# Patient Record
Sex: Female | Born: 1950 | Hispanic: Yes | Smoking: Never smoker
Health system: Southern US, Community
[De-identification: ages and names within clinical notes are randomized; demographics above are authoritative.]

## PROBLEM LIST (undated history)

## (undated) DIAGNOSIS — N289 Disorder of kidney and ureter, unspecified: Secondary | ICD-10-CM

## (undated) DIAGNOSIS — I10 Essential (primary) hypertension: Secondary | ICD-10-CM

## (undated) DIAGNOSIS — E119 Type 2 diabetes mellitus without complications: Secondary | ICD-10-CM

---

## 2016-07-15 ENCOUNTER — Emergency Department
Admission: EM | Admit: 2016-07-15 | Discharge: 2016-07-15 | Disposition: A | Discharge status: Home / Self Care | Payer: Self-pay | Attending: Emergency Medicine | Admitting: Emergency Medicine

## 2016-07-15 ENCOUNTER — Encounter: Payer: Self-pay | Admitting: Emergency Medicine

## 2016-07-15 DIAGNOSIS — Z794 Long term (current) use of insulin: Secondary | ICD-10-CM | POA: Insufficient documentation

## 2016-07-15 DIAGNOSIS — E119 Type 2 diabetes mellitus without complications: Secondary | ICD-10-CM | POA: Insufficient documentation

## 2016-07-15 DIAGNOSIS — Z760 Encounter for issue of repeat prescription: Secondary | ICD-10-CM | POA: Insufficient documentation

## 2016-07-15 DIAGNOSIS — I10 Essential (primary) hypertension: Secondary | ICD-10-CM | POA: Insufficient documentation

## 2016-07-15 HISTORY — DX: Disorder of kidney and ureter, unspecified: N28.9

## 2016-07-15 HISTORY — DX: Essential (primary) hypertension: I10

## 2016-07-15 HISTORY — DX: Type 2 diabetes mellitus without complications: E11.9

## 2016-07-15 LAB — GLUCOSE, CAPILLARY: GLUCOSE-CAPILLARY: 224 mg/dL — AB (ref 65–99)

## 2016-07-15 MED ORDER — INSULIN NPH (HUMAN) (ISOPHANE) 100 UNIT/ML ~~LOC~~ SUSP: SUBCUTANEOUS | 0 refills | Status: AC

## 2016-07-15 NOTE — ED Notes (Signed)
Interpreter requested 

## 2016-07-15 NOTE — ED Triage Notes (Signed)
Patient states she is here visiting from [place] and forgot to bring her insulin.  States she needs her NPH Insulin.

## 2016-07-15 NOTE — ED Notes (Signed)
See triage note  States she is visiting  And forgot her insulin  Per her family she brought all of her PO meds   Denies any other problems at present

## 2016-07-15 NOTE — Discharge Instructions (Signed)
Obtain glucose meter. History insulin as directed by your doctor in [place].

## 2016-07-15 NOTE — ED Provider Notes (Signed)
[facility] Emergency Department Provider Note ____________________________________________  Time seen: 1:53 PM  I have reviewed the triage vital signs and the nursing notes.  HISTORY  Chief Complaint  Medication Refill Family member and Spanish interpreter  HPI Carolyn Ho is a 66 y.o. female this Monday by family members. Patient is visiting here from [place] and forgot to bring her insulin with her. Patient has her syringes but also forgot her glucometer. She is not taking her insulin for approximately 8 days. She denies any symptoms. Patient states that she takes NPH and has a paper that is partially in [place] showing this. Patient shows on her syringe how many units she has been instructed to use.   Past Medical History:  Diagnosis Date  . Diabetes mellitus without complication (HCC)   . Hypertension   . Renal disorder     There are no active problems to display for this patient.   History reviewed. No pertinent surgical history.  Prior to Admission medications   Medication Sig Start Date End Date Taking? Authorizing Provider  insulin NPH Human (HUMULIN N,NOVOLIN N) 100 UNIT/ML injection As directed 07/15/16   Tommi RumpsSummers, Abimelec Grochowski L, PA-C    Allergies Patient has no known allergies.  No family history on file.  Social History Social History  Substance Use Topics  . Smoking status: Never Smoker  . Smokeless tobacco: Never Used  . Alcohol use No    Review of Systems  Constitutional: Negative for fever. Gastrointestinal: Negative for abdominal pain, vomiting and diarrhea. Genitourinary: Negative for dysuria. Skin: Negative for rash. Neurological: Negative for headaches ____________________________________________  PHYSICAL EXAM:  VITAL SIGNS: ED Triage Vitals [07/15/16 1308]  Enc Vitals Group     BP (!) 158/70     Pulse Rate 78     Resp 16     Temp 97.8 F (36.6 C)     Temp Source Oral     SpO2 100 %     Weight    Height      Head Circumference      Peak Flow      Pain Score      Pain Loc      Pain Edu?      Excl. in GC?     Constitutional: Alert and oriented. Well appearing and in no distress. Head: Normocephalic and atraumatic. Eyes: Conjunctivae are normal.  Nose: No congestion Mouth/Throat: Mucous membranes are moist. Neck: No stridor Cardiovascular: Normal rate, regular rhythm. Normal distal pulses. Respiratory: Normal respiratory effort. No wheezes/rales/rhonchi. Musculoskeletal: Nontender with normal range of motion in all extremities.  Neurologic:  Normal gait without ataxia. Normal speech and language. No gross focal neurologic deficits are appreciated. Skin:  Skin is warm, dry and intact. No rash noted. Psychiatric: Mood and affect are normal. Patient exhibits appropriate insight and judgment. ____________________________________________   LABS (pertinent positives/negatives) Finger stick nonfasting was 224. ____________________________________________   INITIAL IMPRESSION / ASSESSMENT AND PLAN / ED COURSE  Insulin prescription was written for the patient. [facility] [employer] [facility] a glucometer for the time that she is here. Patient agrees to check her blood sugars daily. Prescription was written for her insulin and instructions were given to her by the Spanish interpreter.    ____________________________________________  FINAL CLINICAL IMPRESSION(S) / ED DIAGNOSES  Final diagnoses:  Encounter for medication refill     Tommi RumpsSummers, Kearstin Learn L, PA-C 07/15/16 1628    Jeanmarie PlantMcShane, James A, MD 07/16/16 [id number]

## 2017-06-20 ENCOUNTER — Inpatient Hospital Stay (HOSPITAL_COMMUNITY)
Admission: EM | Admit: 2017-06-20 | Discharge: 2017-06-22 | DRG: 689 | Disposition: A | Discharge status: Home / Self Care | Payer: Self-pay | Attending: Internal Medicine | Admitting: Internal Medicine

## 2017-06-20 ENCOUNTER — Encounter (HOSPITAL_COMMUNITY): Payer: Self-pay

## 2017-06-20 ENCOUNTER — Emergency Department (HOSPITAL_COMMUNITY): Payer: Self-pay

## 2017-06-20 ENCOUNTER — Other Ambulatory Visit: Payer: Self-pay

## 2017-06-20 DIAGNOSIS — N10 Acute tubulo-interstitial nephritis: Secondary | ICD-10-CM

## 2017-06-20 DIAGNOSIS — Z794 Long term (current) use of insulin: Secondary | ICD-10-CM

## 2017-06-20 DIAGNOSIS — N186 End stage renal disease: Secondary | ICD-10-CM | POA: Diagnosis present

## 2017-06-20 DIAGNOSIS — D631 Anemia in chronic kidney disease: Secondary | ICD-10-CM | POA: Diagnosis present

## 2017-06-20 DIAGNOSIS — N19 Unspecified kidney failure: Secondary | ICD-10-CM | POA: Diagnosis present

## 2017-06-20 DIAGNOSIS — E1122 Type 2 diabetes mellitus with diabetic chronic kidney disease: Secondary | ICD-10-CM | POA: Diagnosis present

## 2017-06-20 DIAGNOSIS — E875 Hyperkalemia: Secondary | ICD-10-CM | POA: Diagnosis present

## 2017-06-20 DIAGNOSIS — E785 Hyperlipidemia, unspecified: Secondary | ICD-10-CM | POA: Diagnosis present

## 2017-06-20 DIAGNOSIS — J90 Pleural effusion, not elsewhere classified: Secondary | ICD-10-CM | POA: Diagnosis present

## 2017-06-20 DIAGNOSIS — Z87440 Personal history of urinary (tract) infections: Secondary | ICD-10-CM

## 2017-06-20 DIAGNOSIS — N390 Urinary tract infection, site not specified: Principal | ICD-10-CM | POA: Diagnosis present

## 2017-06-20 DIAGNOSIS — I129 Hypertensive chronic kidney disease with stage 1 through stage 4 chronic kidney disease, or unspecified chronic kidney disease: Secondary | ICD-10-CM | POA: Diagnosis present

## 2017-06-20 LAB — CBC WITH DIFFERENTIAL/PLATELET
BASOS ABS: 0 10*3/uL (ref 0.0–0.1)
Basophils Relative: 1 %
EOS PCT: 8 %
Eosinophils Absolute: 0.3 10*3/uL (ref 0.0–0.7)
HEMATOCRIT: 27 % — AB (ref 36.0–46.0)
Hemoglobin: 8.8 g/dL — ABNORMAL LOW (ref 12.0–15.0)
LYMPHS PCT: 33 %
Lymphs Abs: 1.1 10*3/uL (ref 0.7–4.0)
MCH: 29.4 pg (ref 26.0–34.0)
MCHC: 32.6 g/dL (ref 30.0–36.0)
MCV: 90.3 fL (ref 78.0–100.0)
Monocytes Absolute: 0.2 10*3/uL (ref 0.1–1.0)
Monocytes Relative: 5 %
NEUTROS ABS: 1.8 10*3/uL (ref 1.7–7.7)
NEUTROS PCT: 53 %
PLATELETS: 158 10*3/uL (ref 150–400)
RBC: 2.99 MIL/uL — AB (ref 3.87–5.11)
RDW: 13.9 % (ref 11.5–15.5)
WBC: 3.4 10*3/uL — AB (ref 4.0–10.5)

## 2017-06-20 LAB — COMPREHENSIVE METABOLIC PANEL
ALT: 32 U/L (ref 14–54)
ANION GAP: 15 (ref 5–15)
AST: 137 U/L — ABNORMAL HIGH (ref 15–41)
Albumin: 4 g/dL (ref 3.5–5.0)
Alkaline Phosphatase: 57 U/L (ref 38–126)
BILIRUBIN TOTAL: 1.1 mg/dL (ref 0.3–1.2)
BUN: 85 mg/dL — ABNORMAL HIGH (ref 6–20)
CO2: 14 mmol/L — ABNORMAL LOW (ref 22–32)
Calcium: 8.5 mg/dL — ABNORMAL LOW (ref 8.9–10.3)
Chloride: 105 mmol/L (ref 101–111)
Creatinine, Ser: 11.09 mg/dL — ABNORMAL HIGH (ref 0.44–1.00)
GFR, EST AFRICAN AMERICAN: 4 mL/min — AB (ref >60)
GFR, EST NON AFRICAN AMERICAN: 3 mL/min — AB (ref >60)
Glucose, Bld: 95 mg/dL (ref 65–99)
POTASSIUM: 6.2 mmol/L — AB (ref 3.5–5.1)
Sodium: 134 mmol/L — ABNORMAL LOW (ref 135–145)
TOTAL PROTEIN: 6.8 g/dL (ref 6.5–8.1)

## 2017-06-20 LAB — I-STAT CG4 LACTIC ACID, ED: Lactic Acid, Venous: 0.46 mmol/L — ABNORMAL LOW (ref 0.5–1.9)

## 2017-06-20 LAB — I-STAT TROPONIN, ED: Troponin i, poc: 0.04 ng/mL (ref 0.00–0.08)

## 2017-06-20 MED ORDER — DEXTROSE 50 % IV SOLN
50.0000 mL | Freq: Once | INTRAVENOUS | Status: AC
  Administered 2017-06-21: 50 mL via INTRAVENOUS
  Filled 2017-06-20: qty 50

## 2017-06-20 MED ORDER — CALCIUM GLUCONATE 10 % IV SOLN: 1.0000 g | Freq: Once | INTRAVENOUS | Status: DC

## 2017-06-20 MED ORDER — SODIUM CHLORIDE 0.9 % IV SOLN
1000.0000 mL | INTRAVENOUS | Status: DC
  Administered 2017-06-21: 1000 mL via INTRAVENOUS

## 2017-06-20 MED ORDER — SODIUM CHLORIDE 0.9 % IV SOLN
1.0000 g | Freq: Once | INTRAVENOUS | Status: AC
  Administered 2017-06-21: 1 g via INTRAVENOUS
  Filled 2017-06-20: qty 10

## 2017-06-20 MED ORDER — SODIUM POLYSTYRENE SULFONATE 15 GM/60ML PO SUSP
15.0000 g | Freq: Once | ORAL | Status: AC
  Administered 2017-06-21: 15 g via ORAL
  Filled 2017-06-20: qty 60

## 2017-06-20 MED ORDER — INSULIN ASPART 100 UNIT/ML ~~LOC~~ SOLN
6.0000 [IU] | Freq: Once | SUBCUTANEOUS | Status: AC
  Administered 2017-06-21: 6 [IU] via INTRAVENOUS
  Filled 2017-06-20: qty 1

## 2017-06-20 MED ORDER — SODIUM BICARBONATE 8.4 % IV SOLN
50.0000 meq | Freq: Once | INTRAVENOUS | Status: AC
  Administered 2017-06-20: 50 meq via INTRAVENOUS
  Filled 2017-06-20: qty 50

## 2017-06-20 MED ORDER — SODIUM CHLORIDE 0.9 % IV BOLUS: 1000.0000 mL | Freq: Once | INTRAVENOUS | Status: DC

## 2017-06-20 MED ORDER — SODIUM CHLORIDE 0.9 % IV SOLN: 1.0000 g | Freq: Once | INTRAVENOUS | Status: DC

## 2017-06-20 MED ORDER — SODIUM CHLORIDE 0.9 % IV BOLUS
500.0000 mL | Freq: Once | INTRAVENOUS | Status: AC
  Administered 2017-06-20: 500 mL via INTRAVENOUS

## 2017-06-20 NOTE — ED Notes (Signed)
Pt urinated but did not give urine specimen, specimen still needed.

## 2017-06-20 NOTE — ED Triage Notes (Signed)
Pt here with family due to generalized weakness and generalized bodyaches x 2 days. Hx of DM and kidney failure. Pt is visiting from [place] and has been here for 1 month. Pt was set to leave to go back until this happened. VSS

## 2017-06-20 NOTE — ED Provider Notes (Addendum)
[facility] EMERGENCY DEPARTMENT Provider Note   CSN: [id number] Arrival date & time: 06/20/17  1940     History   Chief Complaint Chief Complaint  Patient presents with  . Weakness  . Generalized Body Aches    HPI Carolyn Ho is a 67 y.o. female.  HPI  67 year old female comes in with chief complaint of weakness.  History is provided by patient's son, patient and the son both are comfortable with patient's son translating and did not want [place] to utilize a translation services which were offered.  According to patient's son, patient has been in the [place] for about 1 month now.  She started feeling weak 2 days ago and also started complaining of generalized body aches and lower abdominal pain.  Patient has history of UTI which has caused similar symptoms in the past.  Patient's past medical history significant for diabetes, chronic kidney disease.  Patient has been offered hemodialysis in the past which she has refused.  Patient denies any chest pain, shortness of breath, cough, headaches, vomiting, diarrhea, rash.  Past Medical History:  Diagnosis Date  . Diabetes mellitus without complication (HCC)   . Hypertension   . Renal disorder     Patient Active Problem List   Diagnosis Date Noted  . Renal failure 06/21/2017    History reviewed. No pertinent surgical history.   OB History   None      Home Medications    Prior to Admission medications   Medication Sig Start Date End Date Taking? Authorizing Provider  amLODipine (NORVASC) 5 MG tablet Take 5 mg by mouth 2 (two) times daily.   Yes [provider]  insulin NPH Human (HUMULIN N,NOVOLIN N) 100 UNIT/ML injection As directed Patient taking differently: Inject 13 Units into the skin every morning. As directed 07/15/16  Yes Levada Schilling, Rhonda L, PA-C  losartan (COZAAR) 50 MG tablet Take 50 mg by mouth 2 (two) times daily.   Yes [provider]    Family History History  reviewed. No pertinent family history.  Social History Social History   Tobacco Use  . Smoking status: Never Smoker  . Smokeless tobacco: Never Used  Substance Use Topics  . Alcohol use: No  . Drug use: No     Allergies   Patient has no known allergies.   Review of Systems Review of Systems  Constitutional: Positive for activity change.  Gastrointestinal: Positive for abdominal pain.  Neurological: Positive for weakness.  All other systems reviewed and are negative.    Physical Exam Updated Vital Signs BP 135/67   Pulse 76   Temp 98.2 F (36.8 C) (Oral)   Resp (!) 8   Wt 79 kg (174 lb 2.6 oz)   SpO2 94%   Physical Exam  Constitutional: She is oriented to person, place, and time. She appears well-developed.  HENT:  Head: Normocephalic and atraumatic.  Eyes: EOM are normal.  Neck: Neck supple.  Cardiovascular: Normal rate.  Pulmonary/Chest: Effort normal.  Abdominal: Bowel sounds are normal. There is tenderness. There is guarding. There is no rebound.  Lower quadrant tenderness, worse in the left lower quadrant  Neurological: She is alert and oriented to person, place, and time.  Skin: Skin is warm and dry.  Nursing note and vitals reviewed.    ED Treatments / Results  Labs (all labs ordered are listed, but only abnormal results are displayed) Labs Reviewed  COMPREHENSIVE METABOLIC PANEL - Abnormal; Notable for the following components:  Result Value   Sodium 134 (*)    Potassium 6.2 (*)    CO2 14 (*)    BUN 85 (*)    Creatinine, Ser 11.09 (*)    Calcium 8.5 (*)    AST 137 (*)    GFR calc non Af Amer 3 (*)    GFR calc Af Amer 4 (*)    All other components within normal limits  CBC WITH DIFFERENTIAL/PLATELET - Abnormal; Notable for the following components:   WBC 3.4 (*)    RBC 2.99 (*)    Hemoglobin 8.8 (*)    HCT 27.0 (*)    All other components within normal limits  I-STAT CG4 LACTIC ACID, ED - Abnormal; Notable for the following  components:   Lactic Acid, Venous 0.46 (*)    All other components within normal limits  CULTURE, BLOOD (ROUTINE X 2)  CULTURE, BLOOD (ROUTINE X 2)  URINE CULTURE  URINALYSIS, ROUTINE W REFLEX MICROSCOPIC  FERRITIN  IRON AND TIBC  VITAMIN B12  FOLATE RBC  HIV ANTIBODY (ROUTINE TESTING)  MAGNESIUM  PHOSPHORUS  BASIC METABOLIC PANEL  CBC  CBC  CREATININE, SERUM  LIPASE, BLOOD  I-STAT TROPONIN, ED  I-STAT CG4 LACTIC ACID, ED  I-STAT CG4 LACTIC ACID, ED    EKG EKG Interpretation  Date/Time:  Saturday Jun 20 2017 20:56:27 EDT Ventricular Rate:  79 PR Interval:  192 QRS Duration: 100 QT Interval:  384 QTC Calculation: 440 R Axis:   52 Text Interpretation:  Normal sinus rhythm Anterior infarct , age undetermined Abnormal ECG Nonspecific T wave abnormality No old tracing to compare Confirmed by Dione Booze ([id number]) on 06/20/2017 11:41:01 PM Also confirmed by Derwood Kaplan [phone])  on 06/21/2017 12:24:00 AM   Radiology Ct Abdomen Pelvis Wo Contrast  Result Date: 06/21/2017 CLINICAL DATA:  Weakness and generalized body ache. EXAM: CT ABDOMEN AND PELVIS WITHOUT CONTRAST TECHNIQUE: Multidetector CT imaging of the abdomen and pelvis was performed following the standard protocol without IV contrast. COMPARISON:  None. FINDINGS: Lower chest: Cardiomegaly with trace pericardial effusion. Small bilateral pleural effusions with adjacent atelectasis. Hepatobiliary: Uncomplicated cholelithiasis with a 7 mm dependent gallstone seen near the neck. No secondary signs of acute cholecystitis. The unenhanced liver is unremarkable. Pancreas: Mild peripancreatic edema about the head and uncinate process. Correlate to exclude pancreatitis. No ductal dilatation or mass. Spleen: No splenomegaly. Small splenule near the hilum and lower pole. Adrenals/Urinary Tract: Normal bilateral adrenal glands. Nonspecific perinephric fat stranding about both kidneys. No hydroureteronephrosis. Unremarkable appearance of  the urinary bladder without focal mural thickening or calculus. Stomach/Bowel: Contrast filled stomach with normal small bowel rotation. No bowel obstruction or inflammation. Normal appendix and colon. Vascular/Lymphatic: Mild aortoiliac atherosclerosis without aneurysm. No lymphadenopathy. Reproductive: Hysterectomy.  No adnexal mass. Other: Small amount of free fluid. Soft tissue anasarca of the abdomen and pelvis. Musculoskeletal: No aggressive osseous abnormality. 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 IMPRESSION: 1. Cardiomegaly, small bilateral pleural effusions, small amount of free fluid in the pelvis and mild diffuse soft tissue anasarca may reflect changes of right heart failure. 2. Nonspecific perinephric fat stranding is seen about both kidneys which can be seen in urinary tract infections. No obstructive uropathy. 3. Uncomplicated cholelithiasis. 4. Mild peripancreatic edema about the pancreatic head and uncinate process. This could represent pancreatitis. Correlate clinically. These results were called by telephone at the time of interpretation on 06/21/2017 at 1:39 am to Dr. Derwood Kaplan , who verbally acknowledged these results. Electronically  Signed   By: Tollie Eth M.D.   On: 06/21/2017 01:39   Dg Chest 2 View  Result Date: 06/20/2017 CLINICAL DATA:  67 year old female with weakness and body aches for 2 days. Kidney failure. EXAM: CHEST - 2 VIEW COMPARISON:  None. FINDINGS: Upright AP and lateral views of the chest. Moderate cardiomegaly. Other mediastinal contours are within normal limits. Visualized tracheal air column is within normal limits. Small bilateral pleural effusions with associated bibasilar hypo ventilation. Superimposed mild diffuse pulmonary vascular congestion. No pneumothorax or consolidation. Osteopenia. No acute osseous abnormality identified. Negative visible bowel gas pattern. IMPRESSION: Cardiomegaly with pulmonary interstitial edema  and small bilateral pleural effusions. Electronically Signed   By: Odessa Fleming M.D.   On: 06/20/2017 20:44    Procedures Procedures (including critical care time)  CRITICAL CARE Performed by: Aleksa Collinsworth   Total critical care time: 38 minutes  Critical care time was exclusive of separately billable procedures and treating other patients.  Critical care was necessary to treat or prevent imminent or life-threatening deterioration.  Critical care was time spent personally by me on the following activities: development of treatment plan with patient and/or surrogate as well as nursing, discussions with consultants, evaluation of patient's response to treatment, examination of patient, obtaining history from patient or surrogate, ordering and performing treatments and interventions, ordering and review of laboratory studies, ordering and review of radiographic studies, pulse oximetry and re-evaluation of patient's condition.    Medications Ordered in ED Medications  cefTRIAXone (ROCEPHIN) 1 g in sodium chloride 0.9 % 100 mL IVPB (1 g Intravenous New Bag/Given 06/21/17 0130)  acetaminophen (TYLENOL) tablet 650 mg (has no administration in time range)    Or  acetaminophen (TYLENOL) suppository 650 mg (has no administration in time range)  oxyCODONE (Oxy IR/ROXICODONE) immediate release tablet 5 mg (has no administration in time range)  senna-docusate (Senokot-S) tablet 1 tablet (has no administration in time range)  bisacodyl (DULCOLAX) EC tablet 5 mg (has no administration in time range)  magnesium citrate solution 1 Bottle (has no administration in time range)  ondansetron (ZOFRAN) tablet 4 mg (has no administration in time range)    Or  ondansetron (ZOFRAN) injection 4 mg (has no administration in time range)  albuterol (PROVENTIL) (2.5 MG/3ML) 0.083% nebulizer solution 2.5 mg (has no administration in time range)  ipratropium (ATROVENT) nebulizer solution 0.5 mg (has no administration in  time range)  heparin injection 5,000 Units (has no administration in time range)  cefTRIAXone (ROCEPHIN) 1 g in sodium chloride 0.9 % 100 mL IVPB (1 g Intravenous Not Given 06/21/17 0140)  insulin aspart (novoLOG) injection 0-15 Units (has no administration in time range)  sodium chloride 0.9 % bolus 500 mL (0 mLs Intravenous Stopped 06/21/17 0058)  sodium polystyrene (KAYEXALATE) 15 GM/60ML suspension 15 g (15 g Oral Given 06/21/17 0003)  sodium bicarbonate injection 50 mEq (50 mEq Intravenous Given 06/20/17 2356)  insulin aspart (novoLOG) injection 6 Units (6 Units Intravenous Given 06/21/17 0000)  dextrose 50 % solution 50 mL (50 mLs Intravenous Given 06/21/17 0001)  calcium gluconate 1 g in sodium chloride 0.9 % 100 mL IVPB (0 g Intravenous Stopped 06/21/17 0040)     Initial Impression / Assessment and Plan / ED Course  I have reviewed the triage vital signs and the nursing notes.  Pertinent labs & imaging results that were available during my care of the patient were reviewed by me and considered in my medical decision making (see chart for details).  Clinical  Course as of Jun 21 198  Sun Jun 21, 2017  0157 Patient was already admitted to the hospitalist.  CT scan read has now come up and there is some suspicion of pancreatitis and peripancreatic edema.  On my exam the tenderness was mostly in the lower quadrants.  We will add a lipase.  Patient might need further studies or a specialist for further evaluation.  I have notified Dr. Emmit Pomfret  CT ABDOMEN PELVIS WO CONTRAST [AN]    Clinical Course User Index [AN] Derwood Kaplan, MD    67 year old comes in with chief complaint of generalized weakness.  Patient has history of diabetes, hypertension, CKD.  Patient is from [place] and visiting her children.  She started having weakness about 2 days ago and is noted to have lower abdominal tenderness.  Based on my exam, concerns are for cystitis, diverticulitis, intra-abdominal abscess or  perforation, pyelonephritis, perinephric abscess, renal stones.  Ct ordered.  Additionally, patient is noted to have hyperkalemia.  There is no EKG changes.  Patient will be given medications for her hyper K.  Chest x-ray shows pleural effusion.  No respiratory distress.  I had ordered 500 cc bolus not aware of patient's renal function, as soon as I was informed by patient's family after talking to patient's daughter in [place] that patient has been offered dialysis in the past, we discontinued the normal saline drip.   Final Clinical Impressions(s) / ED Diagnoses   Final diagnoses:  Acute hyperkalemia  Acute pyelonephritis    ED Discharge Orders    None       Derwood Kaplan, MD 06/21/17 0030   Derwood Kaplan, MD 06/21/17 0200

## 2017-06-21 ENCOUNTER — Other Ambulatory Visit: Payer: Self-pay

## 2017-06-21 ENCOUNTER — Emergency Department (HOSPITAL_COMMUNITY): Payer: Self-pay

## 2017-06-21 DIAGNOSIS — N185 Chronic kidney disease, stage 5: Secondary | ICD-10-CM

## 2017-06-21 DIAGNOSIS — E875 Hyperkalemia: Secondary | ICD-10-CM

## 2017-06-21 DIAGNOSIS — N19 Unspecified kidney failure: Secondary | ICD-10-CM | POA: Diagnosis present

## 2017-06-21 DIAGNOSIS — N10 Acute tubulo-interstitial nephritis: Secondary | ICD-10-CM

## 2017-06-21 LAB — URINALYSIS, ROUTINE W REFLEX MICROSCOPIC
Bilirubin Urine: NEGATIVE
GLUCOSE, UA: 50 mg/dL — AB
KETONES UR: NEGATIVE mg/dL
LEUKOCYTES UA: NEGATIVE
Nitrite: POSITIVE — AB
PH: 6 (ref 5.0–8.0)
Protein, ur: >=300 mg/dL — AB
SPECIFIC GRAVITY, URINE: 1.01 (ref 1.005–1.030)

## 2017-06-21 LAB — BASIC METABOLIC PANEL
Anion gap: 13 (ref 5–15)
BUN: 83 mg/dL — AB (ref 6–20)
CHLORIDE: 110 mmol/L (ref 101–111)
CO2: 17 mmol/L — ABNORMAL LOW (ref 22–32)
Calcium: 8.4 mg/dL — ABNORMAL LOW (ref 8.9–10.3)
Creatinine, Ser: 10.75 mg/dL — ABNORMAL HIGH (ref 0.44–1.00)
GFR calc Af Amer: 4 mL/min — ABNORMAL LOW (ref >60)
GFR calc non Af Amer: 3 mL/min — ABNORMAL LOW (ref >60)
Glucose, Bld: 79 mg/dL (ref 65–99)
POTASSIUM: 5.1 mmol/L (ref 3.5–5.1)
SODIUM: 140 mmol/L (ref 135–145)

## 2017-06-21 LAB — VITAMIN B12: VITAMIN B 12: 470 pg/mL (ref 180–914)

## 2017-06-21 LAB — HIV ANTIBODY (ROUTINE TESTING W REFLEX): HIV Screen 4th Generation wRfx: NONREACTIVE

## 2017-06-21 LAB — IRON AND TIBC
IRON: 61 ug/dL (ref 28–170)
Saturation Ratios: 19 % (ref 10.4–31.8)
TIBC: 326 ug/dL (ref 250–450)
UIBC: 265 ug/dL

## 2017-06-21 LAB — CBC
HEMATOCRIT: 24.3 % — AB (ref 36.0–46.0)
HEMATOCRIT: 22.7 % — AB (ref 36.0–46.0)
HEMOGLOBIN: 7.8 g/dL — AB (ref 12.0–15.0)
Hemoglobin: 7.4 g/dL — ABNORMAL LOW (ref 12.0–15.0)
MCH: 29 pg (ref 26.0–34.0)
MCH: 29.4 pg (ref 26.0–34.0)
MCHC: 32.1 g/dL (ref 30.0–36.0)
MCHC: 32.6 g/dL (ref 30.0–36.0)
MCV: 90.3 fL (ref 78.0–100.0)
MCV: 90.1 fL (ref 78.0–100.0)
Platelets: 137 10*3/uL — ABNORMAL LOW (ref 150–400)
Platelets: 120 10*3/uL — ABNORMAL LOW (ref 150–400)
RBC: 2.69 MIL/uL — ABNORMAL LOW (ref 3.87–5.11)
RBC: 2.52 MIL/uL — ABNORMAL LOW (ref 3.87–5.11)
RDW: 14 % (ref 11.5–15.5)
RDW: 13.7 % (ref 11.5–15.5)
WBC: 3.1 10*3/uL — ABNORMAL LOW (ref 4.0–10.5)
WBC: 2.6 10*3/uL — AB (ref 4.0–10.5)

## 2017-06-21 LAB — I-STAT CHEM 8, ED
BUN: 79 mg/dL — AB (ref 6–20)
CHLORIDE: 111 mmol/L (ref 101–111)
Calcium, Ion: 1.04 mmol/L — ABNORMAL LOW (ref 1.15–1.40)
Creatinine, Ser: 11.3 mg/dL — ABNORMAL HIGH (ref 0.44–1.00)
Glucose, Bld: 74 mg/dL (ref 65–99)
HEMATOCRIT: 23 % — AB (ref 36.0–46.0)
Hemoglobin: 7.8 g/dL — ABNORMAL LOW (ref 12.0–15.0)
Potassium: 5.1 mmol/L (ref 3.5–5.1)
SODIUM: 140 mmol/L (ref 135–145)
TCO2: 16 mmol/L — ABNORMAL LOW (ref 22–32)

## 2017-06-21 LAB — CREATININE, SERUM
Creatinine, Ser: 10.73 mg/dL — ABNORMAL HIGH (ref 0.44–1.00)
GFR calc Af Amer: 4 mL/min — ABNORMAL LOW (ref >60)
GFR calc non Af Amer: 3 mL/min — ABNORMAL LOW (ref >60)

## 2017-06-21 LAB — LIPASE, BLOOD: Lipase: 50 U/L (ref 11–51)

## 2017-06-21 LAB — MAGNESIUM: Magnesium: 2.2 mg/dL (ref 1.7–2.4)

## 2017-06-21 LAB — FERRITIN: Ferritin: 198 ng/mL (ref 11–307)

## 2017-06-21 LAB — CBG MONITORING, ED
GLUCOSE-CAPILLARY: 87 mg/dL (ref 65–99)
Glucose-Capillary: 85 mg/dL (ref 65–99)

## 2017-06-21 LAB — GLUCOSE, CAPILLARY
GLUCOSE-CAPILLARY: 85 mg/dL (ref 65–99)
GLUCOSE-CAPILLARY: 90 mg/dL (ref 65–99)
Glucose-Capillary: 138 mg/dL — ABNORMAL HIGH (ref 65–99)

## 2017-06-21 LAB — I-STAT CG4 LACTIC ACID, ED
LACTIC ACID, VENOUS: 0.55 mmol/L (ref 0.5–1.9)
LACTIC ACID, VENOUS: 0.42 mmol/L — AB (ref 0.5–1.9)

## 2017-06-21 LAB — PHOSPHORUS: Phosphorus: 5.9 mg/dL — ABNORMAL HIGH (ref 2.5–4.6)

## 2017-06-21 MED ORDER — HEPARIN SODIUM (PORCINE) 5000 UNIT/ML IJ SOLN
5000.0000 [IU] | Freq: Three times a day (TID) | INTRAMUSCULAR | Status: DC
  Administered 2017-06-21 – 2017-06-22 (×4): 5000 [IU] via SUBCUTANEOUS
  Filled 2017-06-21 (×5): qty 1

## 2017-06-21 MED ORDER — SODIUM BICARBONATE 650 MG PO TABS
1300.0000 mg | ORAL_TABLET | Freq: Two times a day (BID) | ORAL | Status: DC
  Administered 2017-06-21 – 2017-06-22 (×3): 1300 mg via ORAL
  Filled 2017-06-21 (×4): qty 2

## 2017-06-21 MED ORDER — SODIUM CHLORIDE 0.9 % IV SOLN
1.0000 g | Freq: Once | INTRAVENOUS | Status: AC
  Administered 2017-06-21: 1 g via INTRAVENOUS
  Filled 2017-06-21: qty 10

## 2017-06-21 MED ORDER — MAGNESIUM CITRATE PO SOLN: 1.0000 | Freq: Once | ORAL | Status: DC | PRN

## 2017-06-21 MED ORDER — ALBUTEROL SULFATE (2.5 MG/3ML) 0.083% IN NEBU: 2.5000 mg | INHALATION_SOLUTION | Freq: Four times a day (QID) | RESPIRATORY_TRACT | Status: DC | PRN

## 2017-06-21 MED ORDER — ONDANSETRON HCL 4 MG PO TABS: 4.0000 mg | ORAL_TABLET | Freq: Four times a day (QID) | ORAL | Status: DC | PRN

## 2017-06-21 MED ORDER — SODIUM CHLORIDE 0.9 % IV SOLN
1.0000 g | INTRAVENOUS | Status: DC
  Administered 2017-06-22: 1 g via INTRAVENOUS
  Filled 2017-06-21: qty 10

## 2017-06-21 MED ORDER — ACETAMINOPHEN 650 MG RE SUPP: 650.0000 mg | Freq: Four times a day (QID) | RECTAL | Status: DC | PRN

## 2017-06-21 MED ORDER — SODIUM POLYSTYRENE SULFONATE 15 GM/60ML PO SUSP: 30.0000 g | Freq: Once | ORAL | Status: DC

## 2017-06-21 MED ORDER — ONDANSETRON HCL 4 MG/2ML IJ SOLN: 4.0000 mg | Freq: Four times a day (QID) | INTRAMUSCULAR | Status: DC | PRN

## 2017-06-21 MED ORDER — ACETAMINOPHEN 325 MG PO TABS: 650.0000 mg | ORAL_TABLET | Freq: Four times a day (QID) | ORAL | Status: DC | PRN

## 2017-06-21 MED ORDER — SENNOSIDES-DOCUSATE SODIUM 8.6-50 MG PO TABS: 1.0000 | ORAL_TABLET | Freq: Every evening | ORAL | Status: DC | PRN

## 2017-06-21 MED ORDER — OXYCODONE HCL 5 MG PO TABS: 5.0000 mg | ORAL_TABLET | ORAL | Status: DC | PRN

## 2017-06-21 MED ORDER — INSULIN ASPART 100 UNIT/ML ~~LOC~~ SOLN: 0.0000 [IU] | Freq: Three times a day (TID) | SUBCUTANEOUS | Status: DC

## 2017-06-21 MED ORDER — IPRATROPIUM BROMIDE 0.02 % IN SOLN: 0.5000 mg | Freq: Four times a day (QID) | RESPIRATORY_TRACT | Status: DC | PRN

## 2017-06-21 MED ORDER — SODIUM POLYSTYRENE SULFONATE 15 GM/60ML PO SUSP
15.0000 g | Freq: Once | ORAL | Status: AC
  Administered 2017-06-21: 15 g via ORAL
  Filled 2017-06-21: qty 60

## 2017-06-21 MED ORDER — SODIUM CHLORIDE 0.9 % IV SOLN
510.0000 mg | Freq: Once | INTRAVENOUS | Status: AC
  Administered 2017-06-21: 510 mg via INTRAVENOUS
  Filled 2017-06-21 (×2): qty 17

## 2017-06-21 MED ORDER — BISACODYL 5 MG PO TBEC: 5.0000 mg | DELAYED_RELEASE_TABLET | Freq: Every day | ORAL | Status: DC | PRN

## 2017-06-21 NOTE — H&P (Signed)
History and Physical   [facility] - [place] @ [place] Admission History and Physical [employer], D.O.    Patient Name: Carolyn Ho MR#: [id number] Date of Birth: 03/29/1950 Date of Admission: 06/20/2017  Referring MD/NP/PA: Dr. Rhunette Croft Primary Care Physician: Patient, No Pcp Per  Chief Complaint:  Chief Complaint  Patient presents with  . Weakness  . Generalized Body Aches    HPI: Carolyn Ho is a 67 y.o. female with a known history of HTN, HLD, CKD presents to the emergency department for evaluation of generalized weakness.  Patient was in a usual state of health until two days ago when she developed the onset of generalized weakness and lower abdominal pain which is similar to past UTIs. Associated fevers/chills, dysuria, poor appetite.  Family states she is generally very active.    Patient denies dizziness, chest pain, shortness of breath, N/V/C/D, abdominal pain, changes in mental status.    Of note, she has been offered dialysis in the past but has refused.  Otherwise there has been no change in status. Patient has been taking medication as prescribed and there has been no recent change in medication or diet.  No recent antibiotics.  There has been no recent illness, hospitalizations, travel or sick contacts.     EMS/ED Course: Patient received Rocephin, calcium, insulin, kayexalate, D50, NaHCO3. Medical admission has been requested for further management of UTI/pyelo, hyperkalemia, anemia.  Review of Systems:  CONSTITUTIONAL: Positive fever/chills, fatigue, weakness, negative weight gain/loss, headache. EYES: No blurry or double vision. ENT: No tinnitus, postnasal drip, redness or soreness of the oropharynx. RESPIRATORY: No cough, dyspnea, wheeze.  No hemoptysis.  CARDIOVASCULAR: No chest pain, palpitations, syncope, orthopnea. No lower extremity edema.  GASTROINTESTINAL: No nausea, vomiting, Positive abdominal pain, negative  diarrhea, constipation.  No hematemesis, melena or hematochezia. GENITOURINARY: Positive dysuria, negative frequency, hematuria. ENDOCRINE: No polyuria or nocturia. No heat or cold intolerance. HEMATOLOGY: No anemia, bruising, bleeding. INTEGUMENTARY: No rashes, ulcers, lesions. MUSCULOSKELETAL: No arthritis, gout, dyspnea. NEUROLOGIC: No numbness, tingling, ataxia, seizure-type activity, weakness. PSYCHIATRIC: No anxiety, depression, insomnia.   Past Medical History:  Diagnosis Date  . Diabetes mellitus without complication (HCC)   . Hypertension   . Renal disorder     History reviewed. No pertinent surgical history. Positive cataract surgery.    reports that she has never smoked. She has never used smokeless tobacco. She reports that she does not drink alcohol or use drugs.  No Known Allergies  History reviewed. No pertinent family history.  Prior to Admission medications   Medication Sig Start Date End Date Taking? Authorizing Provider  insulin NPH Human (HUMULIN N,NOVOLIN N) 100 UNIT/ML injection As directed 07/15/16   Tommi Rumps, PA-C    Physical Exam: Vitals:   06/20/17 2302 06/20/17 2305 06/21/17 0003 06/21/17 0030  BP: (!) 143/70  136/64 137/69  Pulse:  75 71 78  Resp:  Temp:      TempSrc:      SpO2:  97% 93% 93%  Weight:        GENERAL: 67 y.o.-year-old female patient, well-developed, chronically ill-appearing, lying in the bed in no acute distress.  Pleasant and cooperative.  Fatigued.  HEENT: Head atraumatic, normocephalic. Pupils equal. Mucus membranes moist.  Sclerae are icteric.  NECK: Supple, full range of motion. No JVD. CHEST: Normal breath sounds bilaterally. No wheezing, rales, rhonchi or crackles. No use of accessory muscles of respiration.  No reproducible chest wall tenderness.  CARDIOVASCULAR:  S1, S2 normal. No murmurs, rubs, or gallops. Cap refill <2 seconds. Pulses intact distally.  ABDOMEN: Soft, nondistended, mild tenderness in  suprapubic region. No rebound, guarding, rigidity. Normoactive bowel sounds present in all four quadrants.  EXTREMITIES: No pedal edema, cyanosis, or clubbing. No calf tenderness or Homan's sign.  NEUROLOGIC: The patient is alert and oriented x 3. Cranial nerves II through XII are grossly intact with no focal sensorimotor deficit. PSYCHIATRIC:  Normal affect, mood, thought content. SKIN: Warm, dry, and intact without obvious rash, lesion, or ulcer.    Labs on Admission:  CBC: Recent Labs  Lab 06/20/17 2029  WBC 3.4*  NEUTROABS 1.8  HGB 8.8*  HCT 27.0*  MCV 90.3  PLT 158   Basic Metabolic Panel: Recent Labs  Lab 06/20/17 2029  NA 134*  K 6.2*  CL 105  CO2 14*  GLUCOSE 95  BUN 85*  CREATININE 11.09*  CALCIUM 8.5*   GFR: CrCl cannot be calculated (Unknown ideal weight.). Liver Function Tests: Recent Labs  Lab 06/20/17 2029  AST 137*  ALT 32  ALKPHOS 57  BILITOT 1.1  PROT 6.8  ALBUMIN 4.0   No results for input(s): LIPASE, AMYLASE in the last 168 hours. No results for input(s): AMMONIA in the last 168 hours. Coagulation Profile: No results for input(s): INR, PROTIME in the last 168 hours. Cardiac Enzymes: No results for input(s): CKTOTAL, CKMB, CKMBINDEX, TROPONINI in the last 168 hours. BNP (last 3 results) No results for input(s): PROBNP in the last 8760 hours. HbA1C: No results for input(s): HGBA1C in the last 72 hours. CBG: No results for input(s): GLUCAP in the last 168 hours. Lipid Profile: No results for input(s): CHOL, HDL, LDLCALC, TRIG, CHOLHDL, LDLDIRECT in the last 72 hours. Thyroid Function Tests: No results for input(s): TSH, T4TOTAL, FREET4, T3FREE, THYROIDAB in the last 72 hours. Anemia Panel: No results for input(s): VITAMINB12, FOLATE, FERRITIN, TIBC, IRON, RETICCTPCT in the last 72 hours. Urine analysis: No results found for: COLORURINE, APPEARANCEUR, LABSPEC, PHURINE, GLUCOSEU, HGBUR, BILIRUBINUR, KETONESUR, PROTEINUR, UROBILINOGEN,  NITRITE, LEUKOCYTESUR Sepsis Labs: (procalcitonin:4,lacticidven:4) )No results found for this or any previous visit (from the past 240 hour(s)).   Radiological Exams on Admission: Dg Chest 2 View  Result Date: 06/20/2017 CLINICAL DATA:  67 year old female with weakness and body aches for 2 days. Kidney failure. EXAM: CHEST - 2 VIEW COMPARISON:  None. FINDINGS: Upright AP and lateral views of the chest. Moderate cardiomegaly. Other mediastinal contours are within normal limits. Visualized tracheal air column is within normal limits. Small bilateral pleural effusions with associated bibasilar hypo ventilation. Superimposed mild diffuse pulmonary vascular congestion. No pneumothorax or consolidation. Osteopenia. No acute osseous abnormality identified. Negative visible bowel gas pattern. IMPRESSION: Cardiomegaly with pulmonary interstitial edema and small bilateral pleural effusions. Electronically Signed   By: Odessa Fleming M.D.   On: 06/20/2017 20:44    EKG: Normal sinus rhythm at 79 bpm with normal axis and nonspecific ST-T wave changes.   Assessment/Plan  This is a 67 y.o. female with a history of DM, HTN, CKD now being admitted with:  #. Abdominal pain rule out UTI/Pyelonephritis - Admit inpatient - IV Abx: Rocephin - Follow up cultures - LFTs mostly normal but with mild jaundice and CT with question of pancreatitis - will obtain abdominal US.   #. ESRD, with sequelae (hyperkalemia, pleural effusions, anemia)  - Has refused HD in the past, confirms tonight that she does not want HD - Nephrology consult   #. Hyperkalemia, treated in ED  with Ca, glucose, insulin, sodium bicarb - Telemetry monitoring - Repeat BMP now and in 4 hours  #. Anemia - Serial CBC - Check FOBT, ferritin, Fe, TIBC  #. H/o Diabetes - Accuchecks achs with RISS coverage - Heart healthy, carb controlled diet  Admission status: Inpatient IV Fluids: NS Diet/Nutrition: Carb controlled Consults called:  Nephrology  DVT Px: Heparin, SCDs and early ambulation. Code Status: Full Code  Disposition Plan: To home in 1-2 days  All the records are reviewed and case discussed with ED provider. Management plans discussed with the patient and/or family who express understanding and agree with plan of care.  Raynah Gomes D.O. on 06/21/2017 at 1:16 AM CC: Primary care physician; Patient, No Pcp Per   06/21/2017, 1:16 AM

## 2017-06-21 NOTE — Progress Notes (Signed)
PROGRESS NOTE    Carolyn Ho  NWG:[id number] DOB: [birth year] DOA: 06/20/2017 PCP: Patient, No Pcp Per    Brief Narrative: Carolyn Ho is a 67 y.o. female with a known history of HTN, HLD, CKD presents to the emergency department for evaluation of generalized weakness.  Patient was in a usual state of health until two days ago when she developed the onset of generalized weakness and lower abdominal pain which is similar to past UTIs. Associated fevers/chills, dysuria, poor appetite.  Family states she is generally very active.    Patient denies dizziness, chest pain, shortness of breath, N/V/C/D, abdominal pain, changes in mental status.    Of note, she has been offered dialysis in the past but has refused.  Otherwise there has been no change in status. Patient has been taking medication as prescribed and there has been no recent change in medication or diet.  No recent antibiotics.  There has been no recent illness, hospitalizations, travel or sick contacts.     EMS/ED Course: Patient received Rocephin, calcium, insulin, kayexalate, D50, NaHCO3. Medical admission has been requested for further management of UTI/pyelo, hyperkalemia, anemia.    Assessment & Plan:   Active Problems:   Renal failure  1-ESRD;  Discussed with patient and daughter who were at bedside, explain that [name] needs HD, her kidneys are not working, over time she could be come confuse and could die from arrhythmia from electrolytes abnormalities. Patient related that she does not want HD. She saw her husband suffering with HD. She whishes to go back to [place] this month. . I spoke to patient is her primary language.  -will start bicarb tablet.  -Kayexalate for hyperkalemia.  -strict I and O.   2-UTI; Continue with IV ceftriaxone.   3-Weakness, related to anemia, renal failure.   4-Anemia; will give IV iron.  Repeat labs in am.   5-abdominal pain; doubt pancreatitis. Might be related to  UTI.  FU [place].   6-Hyperkalemia;  Kayexalate.   7-DM; SSI.   DVT prophylaxis: heparin  Code Status: full code.  Family Communication: multiple family at bedside.  Disposition Plan: home when stable.    Consultants:   none   Procedures: [place]  Antimicrobials: ceftriaxone  Subjective: She report feeling ok, she presents today with weakness.  Mild supra-pubic pain  Decline HD  Objective: Vitals:   06/21/17 0530 06/21/17 0600 06/21/17 0630 06/21/17 0700  BP: 138/81 133/77 (!) 145/76 135/73  Pulse: 74 73 73 74  Resp: Temp:      TempSrc:      SpO2: 93% 93% 94% 93%  Weight:        Intake/Output Summary (Last 24 hours) at 06/21/2017 0757 Last data filed at 06/21/2017 0058 Gross per 24 hour  Intake 500 ml  Output -  Net 500 ml   Filed Weights   06/20/17 1955  Weight: 79 kg (174 lb 2.6 oz)    Examination:  General exam: Appears calm and comfortable  Respiratory system: Clear to auscultation. Respiratory effort normal. Cardiovascular system: S1 & S2 heard, RRR. No JVD, murmurs, rubs, gallops or clicks. No pedal edema. Gastrointestinal system: Abdomen is nondistended, soft and nontender. No organomegaly or masses felt. Normal bowel sounds heard. Central nervous system: Alert and oriented. No focal neurological deficits. Extremities: Symmetric 5 x 5 power. Skin: No rashes, lesions or ulcers    Data Reviewed: I have personally reviewed following labs and imaging studies  CBC: Recent Labs  Lab 06/20/17 2029 06/21/17 0140 06/21/17 0321 06/21/17 0359  WBC 3.4* 3.1* 2.6*  --   NEUTROABS 1.8  --   --   --   HGB 8.8* 7.8* 7.4* 7.8*  HCT 27.0* 24.3* 22.7* 23.0*  MCV 90.3 90.3 90.1  --   PLT 158 137* 120*  --    Basic Metabolic Panel: Recent Labs  Lab 06/20/17 2029 06/21/17 0140 06/21/17 0321 06/21/17 0359  NA 134*  --  140 140  K 6.2*  --  5.1 5.1  CL 105  --  110 111  CO2 14*  --  17*  --   GLUCOSE 95  --  79 74  BUN 85*  --  83* 79*    CREATININE 11.09* 10.73* 10.75* 11.30*  CALCIUM 8.5*  --  8.4*  --   MG  --  2.2  --   --   PHOS  --  5.9*  --   --    GFR: CrCl cannot be calculated (Unknown ideal weight.). Liver Function Tests: Recent Labs  Lab 06/20/17 2029  AST 137*  ALT 32  ALKPHOS 57  BILITOT 1.1  PROT 6.8  ALBUMIN 4.0   Recent Labs  Lab 06/21/17 0321  LIPASE 50   No results for input(s): AMMONIA in the last 168 hours. Coagulation Profile: No results for input(s): INR, PROTIME in the last 168 hours. Cardiac Enzymes: No results for input(s): CKTOTAL, CKMB, CKMBINDEX, TROPONINI in the last 168 hours. BNP (last 3 results) No results for input(s): PROBNP in the last 8760 hours. HbA1C: No results for input(s): HGBA1C in the last 72 hours. CBG: No results for input(s): GLUCAP in the last 168 hours. Lipid Profile: No results for input(s): CHOL, HDL, LDLCALC, TRIG, CHOLHDL, LDLDIRECT in the last 72 hours. Thyroid Function Tests: No results for input(s): TSH, T4TOTAL, FREET4, T3FREE, THYROIDAB in the last 72 hours. Anemia Panel: Recent Labs    06/21/17 0140  VITAMINB12 470  FERRITIN 198  TIBC 326  IRON 61   Sepsis Labs: Recent Labs  Lab 06/20/17 2018 06/20/17 2359 06/21/17 0400  LATICACIDVEN 0.46* 0.55 0.42*    No results found for this or any previous visit (from the past 240 hour(s)).       Radiology Studies: Ct Abdomen Pelvis Wo Contrast  Result Date: 06/21/2017 CLINICAL DATA:  Weakness and generalized body ache. EXAM: CT ABDOMEN AND PELVIS WITHOUT CONTRAST TECHNIQUE: Multidetector CT imaging of the abdomen and pelvis was performed following the standard protocol without IV contrast. COMPARISON:  None. FINDINGS: Lower chest: Cardiomegaly with trace pericardial effusion. Small bilateral pleural effusions with adjacent atelectasis. Hepatobiliary: Uncomplicated cholelithiasis with a 7 mm dependent gallstone seen near the neck. No secondary signs of acute cholecystitis. The unenhanced  liver is unremarkable. Pancreas: Mild peripancreatic edema about the head and uncinate process. Correlate to exclude pancreatitis. No ductal dilatation or mass. Spleen: No splenomegaly. Small splenule near the hilum and lower pole. Adrenals/Urinary Tract: Normal bilateral adrenal glands. Nonspecific perinephric fat stranding about both kidneys. No hydroureteronephrosis. Unremarkable appearance of the urinary bladder without focal mural thickening or calculus. Stomach/Bowel: Contrast filled stomach with normal small bowel rotation. No bowel obstruction or inflammation. Normal appendix and colon. Vascular/Lymphatic: Mild aortoiliac atherosclerosis without aneurysm. No lymphadenopathy. Reproductive: Hysterectomy.  No adnexal mass. Other: Small amount of free fluid. Soft tissue anasarca of the abdomen and pelvis. Musculoskeletal: No aggressive osseous abnormality. 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 IMPRESSION: 1. Cardiomegaly, small bilateral pleural effusions,  small amount of free fluid in the pelvis and mild diffuse soft tissue anasarca may reflect changes of right heart failure. 2. Nonspecific perinephric fat stranding is seen about both kidneys which can be seen in urinary tract infections. No obstructive uropathy. 3. Uncomplicated cholelithiasis. 4. Mild peripancreatic edema about the pancreatic head and uncinate process. This could represent pancreatitis. Correlate clinically. These results were called by telephone at the time of interpretation on 06/21/2017 at 1:39 am to Dr. Derwood Kaplan , who verbally acknowledged these results. Electronically Signed   By: Tollie Eth M.D.   On: 06/21/2017 01:39   Dg Chest 2 View  Result Date: 06/20/2017 CLINICAL DATA:  67 year old female with weakness and body aches for 2 days. Kidney failure. EXAM: CHEST - 2 VIEW COMPARISON:  None. FINDINGS: Upright AP and lateral views of the chest. Moderate cardiomegaly. Other mediastinal contours  are within normal limits. Visualized tracheal air column is within normal limits. Small bilateral pleural effusions with associated bibasilar hypo ventilation. Superimposed mild diffuse pulmonary vascular congestion. No pneumothorax or consolidation. Osteopenia. No acute osseous abnormality identified. Negative visible bowel gas pattern. IMPRESSION: Cardiomegaly with pulmonary interstitial edema and small bilateral pleural effusions. Electronically Signed   By: Odessa Fleming M.D.   On: 06/20/2017 20:44        Scheduled Meds: . heparin  5,000 Units Subcutaneous Q8H  . insulin aspart  0-15 Units Subcutaneous TID WC  . sodium bicarbonate  1,300 mg Oral BID  . sodium polystyrene  15 g Oral Once   Continuous Infusions: . cefTRIAXone (ROCEPHIN)  IV    . ferumoxytol       LOS: 0 days    Time spent: 35 minutes.     Alba Cory, MD [facility] Pager [phone]  If 7PM-7AM, please contact night-coverage [email or web address] Password [facility] 06/21/2017, 7:57 AM

## 2017-06-21 NOTE — ED Notes (Signed)
Report given to greg RN, pt taken to pod E and placed on monitor family at bedside.

## 2017-06-21 NOTE — ED Notes (Signed)
Patient transported to CT 

## 2017-06-21 NOTE — Progress Notes (Signed)
PT Cancellation Note  Patient Details Name: Carolyn Ho MRN: [id number] DOB: 09-20-1950   Cancelled Treatment:     Patient has been admitted. Therapy will follow up when she gets to the floors. She is currently in ED holding pod pending room placement.    Dessie Coma PT DPT  06/21/2017, 10:48 AM

## 2017-06-21 NOTE — ED Notes (Signed)
Unable to give urine sample at this time.  

## 2017-06-22 LAB — PREPARE RBC (CROSSMATCH)

## 2017-06-22 LAB — FOLATE RBC
FOLATE, HEMOLYSATE: 359.7 ng/mL
FOLATE, RBC: 1531 ng/mL (ref >498)
HEMATOCRIT: 23.5 % — AB (ref 34.0–46.6)

## 2017-06-22 LAB — GLUCOSE, CAPILLARY
Glucose-Capillary: 67 mg/dL (ref 65–99)
Glucose-Capillary: 94 mg/dL (ref 65–99)

## 2017-06-22 LAB — HEPATIC FUNCTION PANEL
ALT: 25 U/L (ref 14–54)
AST: 95 U/L — ABNORMAL HIGH (ref 15–41)
Albumin: 3.2 g/dL — ABNORMAL LOW (ref 3.5–5.0)
Alkaline Phosphatase: 37 U/L — ABNORMAL LOW (ref 38–126)
Bilirubin, Direct: <0.1 mg/dL — ABNORMAL LOW (ref 0.1–0.5)
Total Bilirubin: 0.9 mg/dL (ref 0.3–1.2)
Total Protein: 5.9 g/dL — ABNORMAL LOW (ref 6.5–8.1)

## 2017-06-22 LAB — CBC
HCT: 23 % — ABNORMAL LOW (ref 36.0–46.0)
Hemoglobin: 7.5 g/dL — ABNORMAL LOW (ref 12.0–15.0)
MCH: 29.9 pg (ref 26.0–34.0)
MCHC: 32.6 g/dL (ref 30.0–36.0)
MCV: 91.6 fL (ref 78.0–100.0)
PLATELETS: 127 10*3/uL — AB (ref 150–400)
RBC: 2.51 MIL/uL — ABNORMAL LOW (ref 3.87–5.11)
RDW: 14 % (ref 11.5–15.5)
WBC: 2.4 10*3/uL — AB (ref 4.0–10.5)

## 2017-06-22 LAB — URINE CULTURE

## 2017-06-22 LAB — RENAL FUNCTION PANEL
Albumin: 3.3 g/dL — ABNORMAL LOW (ref 3.5–5.0)
Anion gap: 10 (ref 5–15)
BUN: 78 mg/dL — ABNORMAL HIGH (ref 6–20)
CO2: 18 mmol/L — ABNORMAL LOW (ref 22–32)
Calcium: 8.4 mg/dL — ABNORMAL LOW (ref 8.9–10.3)
Chloride: 112 mmol/L — ABNORMAL HIGH (ref 101–111)
Creatinine, Ser: 10.68 mg/dL — ABNORMAL HIGH (ref 0.44–1.00)
GFR calc Af Amer: 4 mL/min — ABNORMAL LOW (ref >60)
GFR calc non Af Amer: 3 mL/min — ABNORMAL LOW (ref >60)
Glucose, Bld: 75 mg/dL (ref 65–99)
Phosphorus: 6.5 mg/dL — ABNORMAL HIGH (ref 2.5–4.6)
Potassium: 4.9 mmol/L (ref 3.5–5.1)
Sodium: 140 mmol/L (ref 135–145)

## 2017-06-22 LAB — ABO/RH: ABO/RH(D): O POS

## 2017-06-22 MED ORDER — SODIUM BICARBONATE 650 MG PO TABS: 1300.0000 mg | ORAL_TABLET | Freq: Three times a day (TID) | ORAL | 0 refills | Status: AC

## 2017-06-22 MED ORDER — SODIUM BICARBONATE 650 MG PO TABS: 1300.0000 mg | ORAL_TABLET | Freq: Three times a day (TID) | ORAL | Status: DC

## 2017-06-22 MED ORDER — SORBITOL 70 % SOLN
30.0000 mL | Freq: Once | Status: AC
  Administered 2017-06-22: 30 mL via ORAL
  Filled 2017-06-22: qty 30

## 2017-06-22 MED ORDER — SODIUM CHLORIDE 0.9 % IV SOLN
Freq: Once | INTRAVENOUS | Status: AC
  Administered 2017-06-22: 12:00:00 via INTRAVENOUS

## 2017-06-22 MED ORDER — FUROSEMIDE 40 MG PO TABS: 40.0000 mg | ORAL_TABLET | Freq: Every day | ORAL | 0 refills | Status: AC | PRN

## 2017-06-22 MED ORDER — FUROSEMIDE 10 MG/ML IJ SOLN
80.0000 mg | Freq: Once | INTRAMUSCULAR | Status: AC
  Administered 2017-06-22: 80 mg via INTRAVENOUS
  Filled 2017-06-22: qty 8

## 2017-06-22 NOTE — Progress Notes (Signed)
Nutrition Education Note  RD consulted for Low Potassium Education. Provided Low and High Potassium Foods handouts to patient/family. Discussed diet recommendations with patient's daughter. Reviewed food groups and provided written recommended serving sizes specifically determined for patient's current nutritional status.   Teach back method used.  Expect good compliance.  Body mass index is 29.83 kg/m. Pt meets criteria for overweight based on current BMI.  Plans for D/C home today. Labs and medications reviewed. No further nutrition interventions warranted at this time. RD contact information provided. If additional nutrition issues arise, please re-consult RD.   Joaquin Courts, RD, LDN, CNSC Pager [phone] After Hours Pager [phone]

## 2017-06-22 NOTE — Evaluation (Signed)
Physical Therapy Evaluation Patient Details Name: Carolyn Ho MRN: [id number] DOB: 12-14-1950 Today's Date: 06/22/2017   History of Present Illness  Carolyn Ho is a 67 y.o. female with a known history of HTN, HLD, CKD presents to the emergency department for evaluation of generalized weakness.  Patient was in a usual state of health until two days ago when she developed the onset of generalized weakness and lower abdominal pain which is similar to past UTIs; found to have low Hgb; transfused 5/13  Clinical Impression   Patient evaluated by Physical Therapy with no further acute PT needs identified. All education has been completed and the patient has no further questions. Modified independent with all mobility and reports she feels MUCH better than she felt at admission.  See below for any follow-up Physical Therapy or equipment needs. PT is signing off. Thank you for this referral.     Follow Up Recommendations No PT follow up    Equipment Recommendations  Other (comment)(shower chair)    Recommendations for Other Services       Precautions / Restrictions Precautions Precautions: None Restrictions Weight Bearing Restrictions: No      Mobility  Bed Mobility Overal bed mobility: Independent                Transfers Overall transfer level: Modified independent Equipment used: None             General transfer comment: noted steadied self with UE support; no difficulty, good control of stand to sit  Ambulation/Gait Ambulation/Gait assistance: Modified independent (Device/Increase time) Ambulation Distance (Feet): 300 Feet Assistive device: None(and pushed IV pole) Gait Pattern/deviations: Step-through pattern Gait velocity: able to step up the pace when asked to   General Gait Details: Overall steady; slightly slow gait, but no loss of balance noted  Stairs            Wheelchair Mobility    Modified Rankin (Stroke Patients Only)       Balance Overall balance assessment: No apparent balance deficits (not formally assessed)                                           Pertinent Vitals/Pain Pain Assessment: No/denies pain    Home Living Family/patient expects to be discharged to:: Private residence Living Arrangements: Children Available Help at Discharge: Family;Available PRN/intermittently Type of Home: House Home Access: Level entry     Home Layout: One level   Additional Comments: They are interested in getting a shower chair    Prior Function Level of Independence: Independent         Comments: Loves in [place]; is on vacation visiting family in [place]     Hand Dominance        Extremity/Trunk Assessment   Upper Extremity Assessment Upper Extremity Assessment: Overall WFL for tasks assessed    Lower Extremity Assessment Lower Extremity Assessment: Overall WFL for tasks assessed       Communication   Communication: No difficulties  Cognition Arousal/Alertness: Awake/alert Behavior During Therapy: WFL for tasks assessed/performed Overall Cognitive Status: Within Functional Limits for tasks assessed                                        General Comments General comments (skin integrity, edema, etc.): Hoover Brunette,  contract interpreter, present to assist with session    Exercises     Assessment/Plan    PT Assessment Patent does not need any further PT services  PT Problem List         PT Treatment Interventions      PT Goals (Current goals can be found in the Care Plan section)  Acute Rehab PT Goals Patient Stated Goal: Hopes to be able to go back to [place] on schedule PT Goal Formulation: All assessment and education complete, DC therapy    Frequency     Barriers to discharge        Co-evaluation               AM-PAC PT "6 Clicks" Daily Activity  Outcome Measure Difficulty turning over in bed (including adjusting bedclothes, sheets  and blankets)?: None Difficulty moving from lying on back to sitting on the side of the bed? : None Difficulty sitting down on and standing up from a chair with arms (e.g., wheelchair, bedside commode, etc,.)?: None Help needed moving to and from a bed to chair (including a wheelchair)?: None Help needed walking in hospital room?: None Help needed climbing 3-5 steps with a railing? : A Little 6 Click Score: 23    End of Session   Activity Tolerance: Patient tolerated treatment well Patient left: in chair;with call bell/phone within reach;with family/visitor present Nurse Communication: Mobility status PT Visit Diagnosis: Muscle weakness (generalized) (M62.81)    Time: 4098-1191 PT Time Calculation (min) (ACUTE ONLY): 26 min   Charges:   PT Evaluation $PT Eval Low Complexity: 1 Low PT Treatments $Gait Training: 8-22 mins   PT G Codes:        Van Clines, PT  Acute Rehabilitation Services Pager [phone] Office [phone]   Levi Aland 06/22/2017, 1:17 PM

## 2017-06-22 NOTE — Discharge Summary (Addendum)
Physician Discharge Summary  Carolyn Ho NFA:[id number] DOB: [birth year] DOA: 06/20/2017  PCP: Patient, No Pcp Per  Admit date: 06/20/2017 Discharge date: 06/22/2017  Admitted From: Home  Disposition: Home   Recommendations for Outpatient Follow-up:  1. Follow up with PCP in 1-2 weeks 2. Please obtain BMP/CBC in one week 3. She refuses HD.   Home Health: none  Discharge Condition: stable.  CODE STATUS: full code.  Diet recommendation: Heart Healthy   Brief/Interim Summary: Brief Narrative: AmaliaGarcia Ramirezis a 67 y.o.femalewith a known history of HTN, HLD, CKDpresents to the emergency department for evaluation of generalized weakness. Patient was in a usual state of health until two days ago when she developed the onset of generalized weakness and lower abdominal pain which is similar to past UTIs.Associated fevers/chills, dysuria, poor appetite. Family states she is generally very active.   Patient denies dizziness, chest pain, shortness of breath, N/V/C/D, abdominal pain, changes in mental status.  Of note, she has been offered dialysis in the past but has refused.Otherwise there has been no change in status. Patient has been taking medication as prescribed and there has been no recent change in medication or diet. No recent antibiotics. There has been no recent illness, hospitalizations, travel or sick contacts.   EMS/ED Course:Patient received Rocephin, calcium, insulin, kayexalate, D50, NaHCO3. Medical admission has been requested for further management ofUTI/pyelo, hyperkalemia, anemia.    Assessment & Plan:   Active Problems:   Renal failure  1-ESRD;  Discussed with patient and daughter who were at bedside, explain that Mrs Carolyn Ho needs HD, her kidneys are not working, over time she could become  confuse and could die from arrhythmia from electrolytes abnormalities. Patient related that she does not want HD. She saw her husband suffering  with HD. She whishes to go back to [place] this month. .- will start bicarb tablet.  -Kayexalate for hyperkalemia.  -lasix PRN/. Discharge home today/   2-UTI; urine culture no significant growth.  Will discontinue ceftriaxone.   3-Weakness, related to anemia, renal failure.  improved.   4-Anemia; recieved  IV iron.  Will give one unit PRBC prior to discharge  5-abdominal pain; doubt pancreatitis. Might be related to UTI.  Resolved.   6-Hyperkalemia;  Kayexalate.  resolved.   7-DM; resume home medications.      Discharge Diagnoses:  Active Problems:   Renal failure    Discharge Instructions  Discharge Instructions    Diet - low sodium heart healthy   Complete by:  As directed    Increase activity slowly   Complete by:  As directed      Allergies as of 06/22/2017   No Known Allergies     Medication List    STOP taking these medications   losartan 50 MG tablet Commonly known as:  COZAAR     TAKE these medications   amLODipine 5 MG tablet Commonly known as:  NORVASC Take 5 mg by mouth 2 (two) times daily.   furosemide 40 MG tablet Commonly known as:  LASIX Take 1 tablet (40 mg total) by mouth daily as needed. For accumulation of fluids.   insulin NPH Human 100 UNIT/ML injection Commonly known as:  HUMULIN N,NOVOLIN N As directed What changed:    how much to take  how to take this  when to take this  additional instructions   sodium bicarbonate 650 MG tablet Take 2 tablets (1,300 mg total) by mouth 3 (three) times daily.       No Known  Allergies  Consultations:  none   Procedures/Studies: Ct Abdomen Pelvis Wo Contrast  Result Date: 06/21/2017 CLINICAL DATA:  Weakness and generalized body ache. EXAM: CT ABDOMEN AND PELVIS WITHOUT CONTRAST TECHNIQUE: Multidetector CT imaging of the abdomen and pelvis was performed following the standard protocol without IV contrast. COMPARISON:  None. FINDINGS: Lower chest: Cardiomegaly with  trace pericardial effusion. Small bilateral pleural effusions with adjacent atelectasis. Hepatobiliary: Uncomplicated cholelithiasis with a 7 mm dependent gallstone seen near the neck. No secondary signs of acute cholecystitis. The unenhanced liver is unremarkable. Pancreas: Mild peripancreatic edema about the head and uncinate process. Correlate to exclude pancreatitis. No ductal dilatation or mass. Spleen: No splenomegaly. Small splenule near the hilum and lower pole. Adrenals/Urinary Tract: Normal bilateral adrenal glands. Nonspecific perinephric fat stranding about both kidneys. No hydroureteronephrosis. Unremarkable appearance of the urinary bladder without focal mural thickening or calculus. Stomach/Bowel: Contrast filled stomach with normal small bowel rotation. No bowel obstruction or inflammation. Normal appendix and colon. Vascular/Lymphatic: Mild aortoiliac atherosclerosis without aneurysm. No lymphadenopathy. Reproductive: Hysterectomy.  No adnexal mass. Other: Small amount of free fluid. Soft tissue anasarca of the abdomen and pelvis. Musculoskeletal: No aggressive osseous abnormality. 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 IMPRESSION: 1. Cardiomegaly, small bilateral pleural effusions, small amount of free fluid in the pelvis and mild diffuse soft tissue anasarca may reflect changes of right heart failure. 2. Nonspecific perinephric fat stranding is seen about both kidneys which can be seen in urinary tract infections. No obstructive uropathy. 3. Uncomplicated cholelithiasis. 4. Mild peripancreatic edema about the pancreatic head and uncinate process. This could represent pancreatitis. Correlate clinically. These results were called by telephone at the time of interpretation on 06/21/2017 at 1:39 am to Dr. Derwood Ho , who verbally acknowledged these results. Electronically Signed   By: Tollie Eth M.D.   On: 06/21/2017 01:39   Dg Chest 2 View  Result Date:  06/20/2017 CLINICAL DATA:  67 year old female with weakness and body aches for 2 days. Kidney failure. EXAM: CHEST - 2 VIEW COMPARISON:  None. FINDINGS: Upright AP and lateral views of the chest. Moderate cardiomegaly. Other mediastinal contours are within normal limits. Visualized tracheal air column is within normal limits. Small bilateral pleural effusions with associated bibasilar hypo ventilation. Superimposed mild diffuse pulmonary vascular congestion. No pneumothorax or consolidation. Osteopenia. No acute osseous abnormality identified. Negative visible bowel gas pattern. IMPRESSION: Cardiomegaly with pulmonary interstitial edema and small bilateral pleural effusions. Electronically Signed   By: Odessa Fleming M.D.   On: 06/20/2017 20:44      Subjective: She is feeling well today.  She continue to refuse HD. Her husband had peritoneal dialysis and he suffer , she said,.  I explain to her that patient could have HD with access through vein ( fistula, graft )  Discharge Exam: Vitals:   06/22/17 1150 06/22/17 1226  BP: (!) 147/74 (!) 143/78  Pulse: 70 72  Resp: 14 16  Temp: 97.8 F (36.6 C) 98.2 F (36.8 C)  SpO2: 94% 95%   Vitals:   06/21/17 2142 06/22/17 0526 06/22/17 1150 06/22/17 1226  BP: (!) 146/80 133/72 (!) 147/74 (!) 143/78  Pulse: 75 66 70 72  Resp: Temp: 98.1 F (36.7 C) 98 F (36.7 C) 97.8 F (36.6 C) 98.2 F (36.8 C)  TempSrc: Oral Oral Oral Oral  SpO2: 96% 94% 94% 95%  Weight: 81.3 kg (179 lb 3.7 oz)     Height:  General: Pt is alert, awake, not in acute distress Cardiovascular: RRR, S1/S2 +, no rubs, no gallops Respiratory: CTA bilaterally, no wheezing, no rhonchi Abdominal: Soft, NT, ND, bowel sounds + Extremities: no edema, no cyanosis    The results of significant diagnostics from this hospitalization (including imaging, microbiology, ancillary and laboratory) are listed below for reference.     Microbiology: Recent Results (from the  past 240 hour(s))  Urine culture     Status: Abnormal   Collection Time: 06/21/17  1:08 AM  Result Value Ref Range Status   Specimen Description URINE, CATHETERIZED  Final   Special Requests NONE  Final   Culture (A)  Final    <10,000 COLONIES/mL INSIGNIFICANT GROWTH Performed at [facility], [address] [place] [address]    Report Status 06/22/2017 FINAL  Final     Labs: BNP (last 3 results) No results for input(s): BNP in the last 8760 hours. Basic Metabolic Panel: Recent Labs  Lab 06/20/17 2029 06/21/17 0140 06/21/17 0321 06/21/17 0359 06/22/17 0714  NA 134*  --  140 140 140  K 6.2*  --  5.1 5.1 4.9  CL 105  --  110 111 112*  CO2 14*  --  17*  --  18*  GLUCOSE 95  --  79 74 75  BUN 85*  --  83* 79* 78*  CREATININE 11.09* 10.73* 10.75* 11.30* 10.68*  CALCIUM 8.5*  --  8.4*  --  8.4*  MG  --  2.2  --   --   --   PHOS  --  5.9*  --   --  6.5*   Liver Function Tests: Recent Labs  Lab 06/20/17 2029 06/22/17 0714  AST 137* 95*  ALT 32 25  ALKPHOS 57 37*  BILITOT 1.1 0.9  PROT 6.8 5.9*  ALBUMIN 4.0 3.3*  3.2*   Recent Labs  Lab 06/21/17 0321  LIPASE 50   No results for input(s): AMMONIA in the last 168 hours. CBC: Recent Labs  Lab 06/20/17 2029 06/21/17 0140 06/21/17 0321 06/21/17 0359 06/22/17 0714  WBC 3.4* 3.1* 2.6*  --  2.4*  NEUTROABS 1.8  --   --   --   --   HGB 8.8* 7.8* 7.4* 7.8* 7.5*  HCT 27.0* 24.3* 22.7* 23.0* 23.0*  MCV 90.3 90.3 90.1  --  91.6  PLT 158 137* 120*  --  127*   Cardiac Enzymes: No results for input(s): CKTOTAL, CKMB, CKMBINDEX, TROPONINI in the last 168 hours. BNP: Invalid input(s): POCBNP CBG: Recent Labs  Lab 06/21/17 1428 06/21/17 1718 06/21/17 2139 06/22/17 0812 06/22/17 1145  GLUCAP 85 90 138* 67 94   D-Dimer No results for input(s): DDIMER in the last 72 hours. Hgb A1c No results for input(s): HGBA1C in the last 72 hours. Lipid Profile No results for input(s): CHOL, HDL, LDLCALC,  TRIG, CHOLHDL, LDLDIRECT in the last 72 hours. Thyroid function studies No results for input(s): TSH, T4TOTAL, T3FREE, THYROIDAB in the last 72 hours.  Invalid input(s): FREET3 Anemia work up Recent Labs    06/21/17 0140  VITAMINB12 470  FERRITIN 198  TIBC 326  IRON 61   Urinalysis    Component Value Date/Time   COLORURINE AMBER (A) 06/21/2017 0100   APPEARANCEUR CLEAR 06/21/2017 0100   LABSPEC 1.010 06/21/2017 0100   PHURINE 6.0 06/21/2017 0100   GLUCOSEU 50 (A) 06/21/2017 0100   HGBUR LARGE (A) 06/21/2017 0100   BILIRUBINUR NEGATIVE 06/21/2017 0100   KETONESUR  NEGATIVE 06/21/2017 0100   PROTEINUR >=300 (A) 06/21/2017 0100   NITRITE POSITIVE (A) 06/21/2017 0100   LEUKOCYTESUR NEGATIVE 06/21/2017 0100   Sepsis Labs Invalid input(s): PROCALCITONIN,  WBC,  LACTICIDVEN Microbiology Recent Results (from the past 240 hour(s))  Urine culture     Status: Abnormal   Collection Time: 06/21/17  1:08 AM  Result Value Ref Range Status   Specimen Description URINE, CATHETERIZED  Final   Special Requests NONE  Final   Culture (A)  Final    <10,000 COLONIES/mL INSIGNIFICANT GROWTH Performed at [facility], [address] [place], [place] [address]    Report Status 06/22/2017 FINAL  Final     Time coordinating discharge: 35 minutes,   SIGNED:   Alba Cory, MD  [facility] 06/22/2017, 2:25 PM Pager [phone]  If 7PM-7AM, please contact night-coverage [email or web address] Password [email or web address]

## 2017-06-22 NOTE — Progress Notes (Signed)
Patient discharged to home, AVS reviewed with patient and daughter, prescriptions provided, patients daughter will call and make follow up appointments. IV removed, tele box returned. Patient left floor via wheelchair with staff member

## 2017-06-23 LAB — TYPE AND SCREEN
ABO/RH(D): O POS
Antibody Screen: NEGATIVE
UNIT DIVISION: 0

## 2017-06-23 LAB — BPAM RBC
Blood Product Expiration Date: 201905192359
ISSUE DATE / TIME: 201905131200
UNIT TYPE AND RH: 5100

## 2017-06-26 LAB — CULTURE, BLOOD (ROUTINE X 2)
Culture: NO GROWTH
Culture: NO GROWTH
SPECIAL REQUESTS: ADEQUATE
SPECIAL REQUESTS: ADEQUATE

## 2018-09-12 IMAGING — CT CT ABD-PELV W/O CM
2 of 4 series · 16 of 46 positions shown, 18 images · non-contrast
Comparison: None.

CLINICAL DATA: Weakness and generalized body ache.

EXAM:
CT ABDOMEN AND PELVIS WITHOUT CONTRAST
TECHNIQUE: Multidetector CT imaging of the abdomen and pelvis was performed
following the standard protocol without IV contrast.

[Series 3: abd/ pelvis 5.0 i30f 2 · axial · 0.84mm/px · z∈[+752,+1187]mm · 13 of 99 slices shown, 15 images]
[im 8/99  soft-tissue]
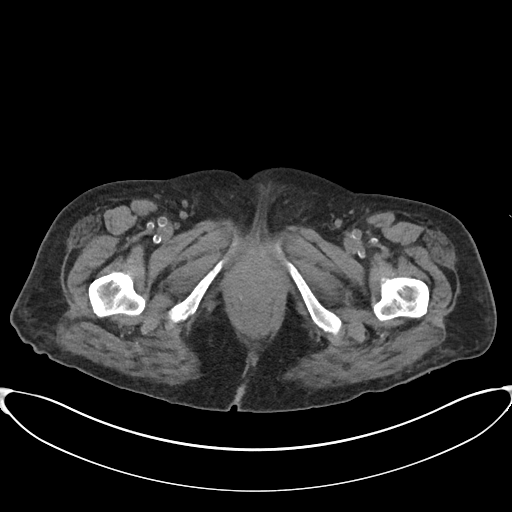
[im 8/99  bone]
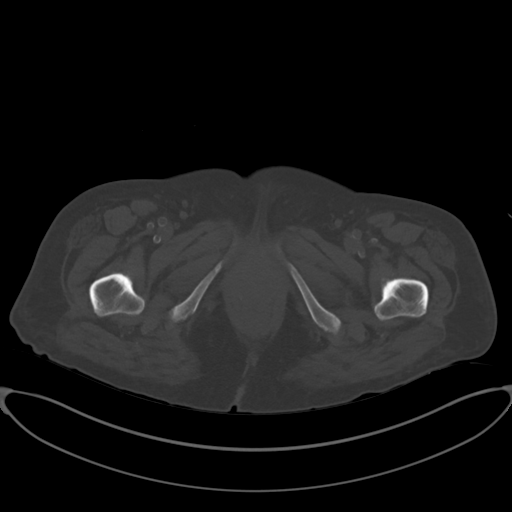
[im 15/99  soft-tissue]
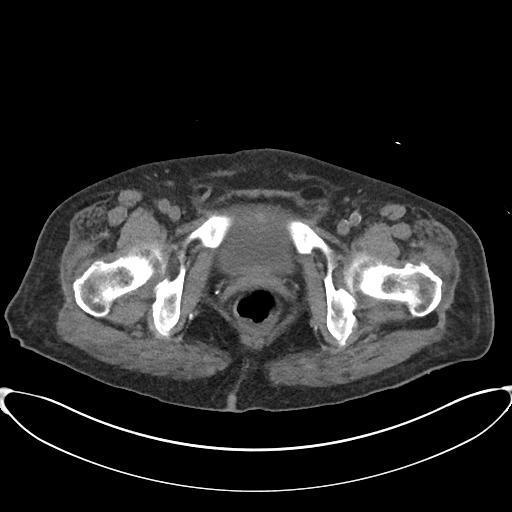
[im 22/99  soft-tissue]
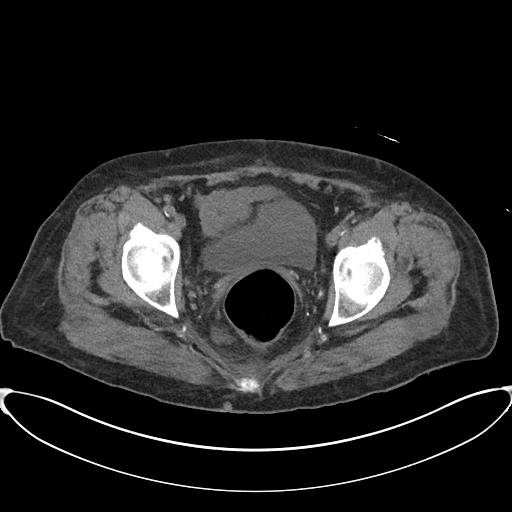
[im 30/99  soft-tissue]
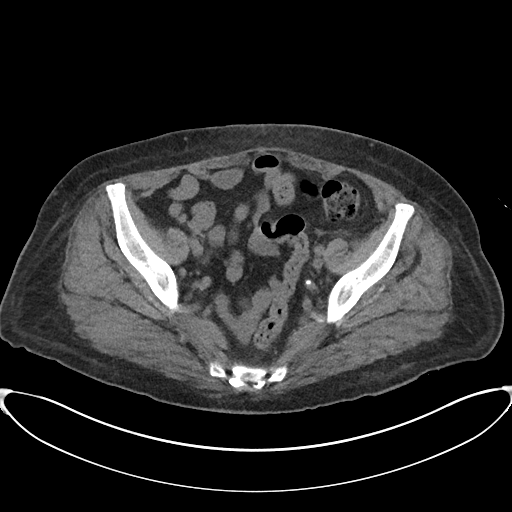
[im 37/99  soft-tissue]
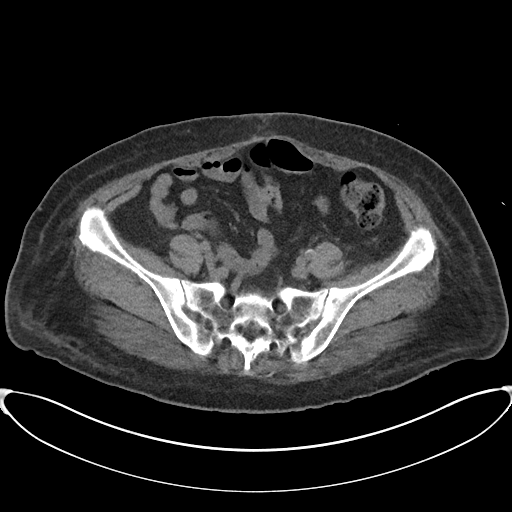
[im 44/99  soft-tissue]
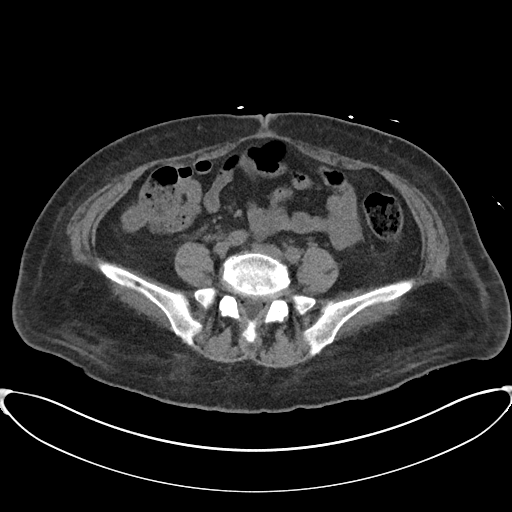
[im 51/99  soft-tissue]
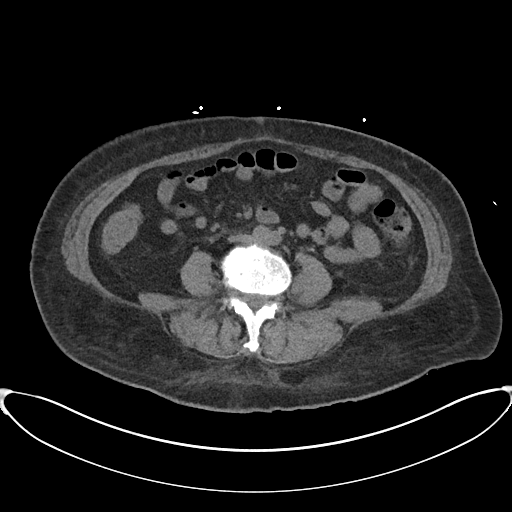
[im 59/99  soft-tissue]
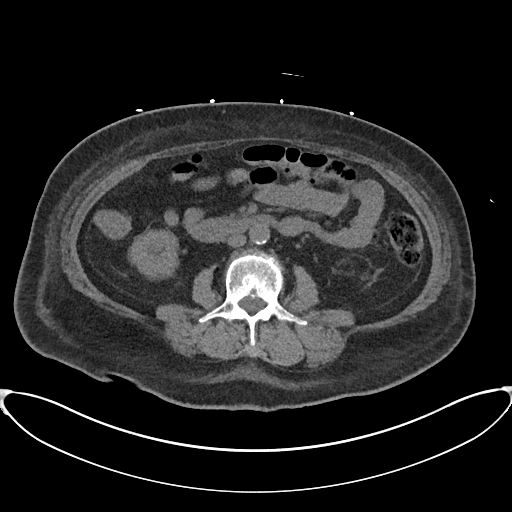
[im 66/99  soft-tissue]
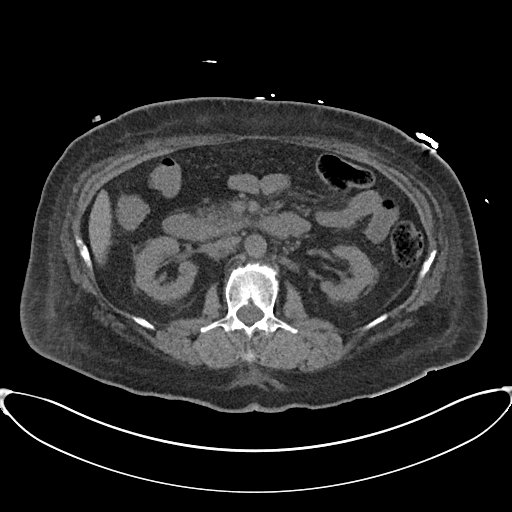
[im 66/99  bone]
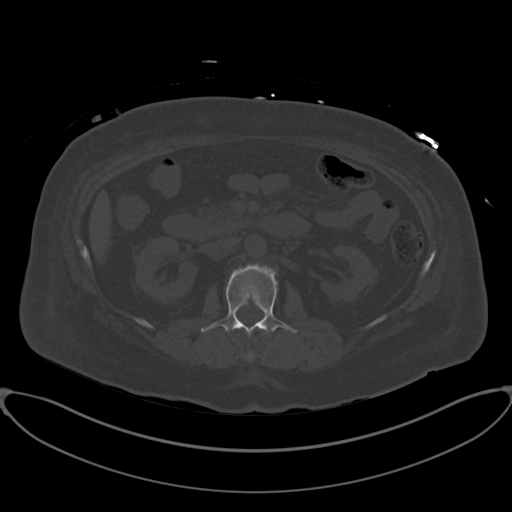
[im 73/99  soft-tissue]
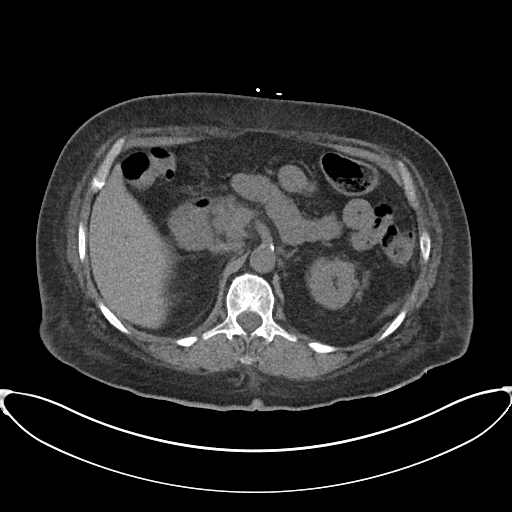
[im 80/99  soft-tissue]
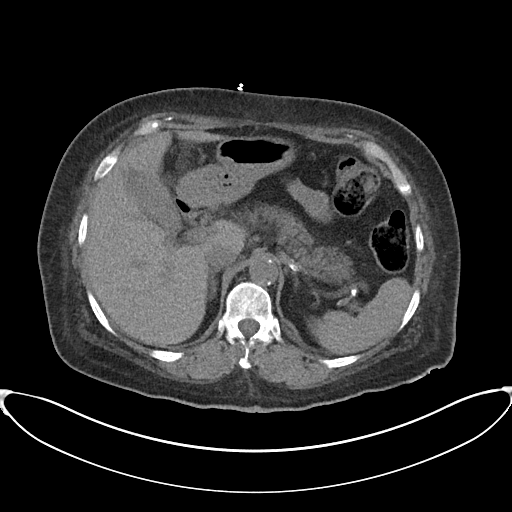
[im 88/99  soft-tissue]
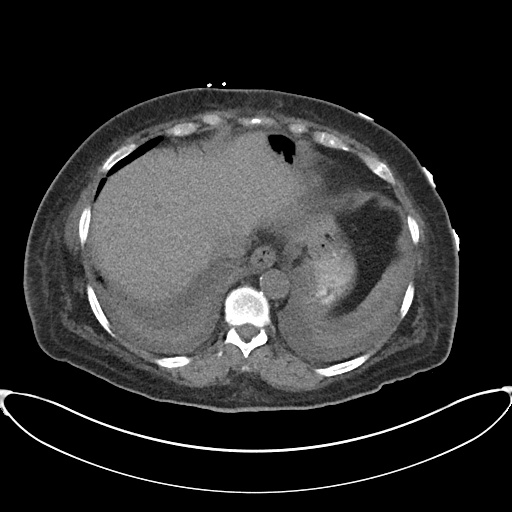
[im 95/99  soft-tissue]
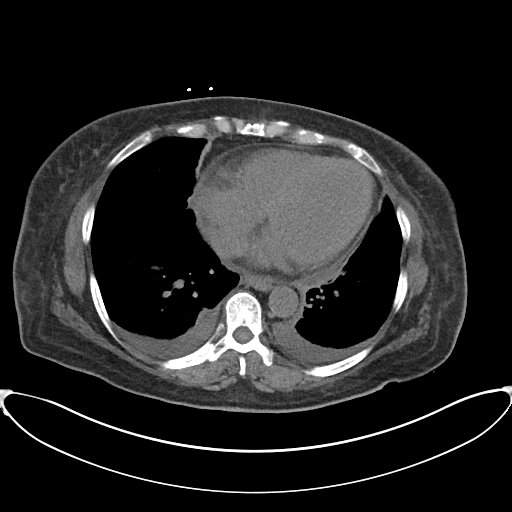

[Series 6: cor st · coronal · 0.94mm/px · 3 of 80 slices shown]
[im 27/80  soft-tissue]
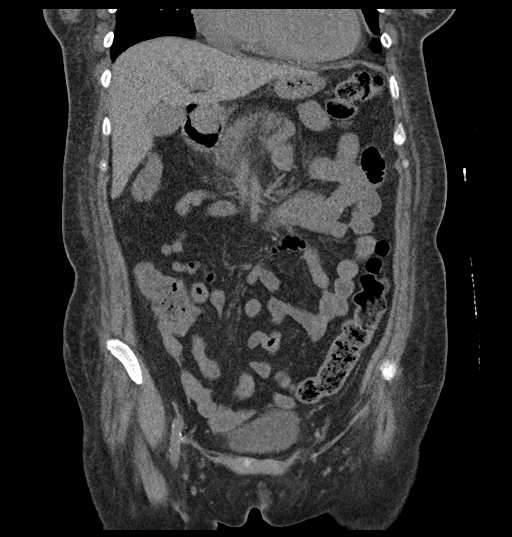
[im 36/80  soft-tissue]
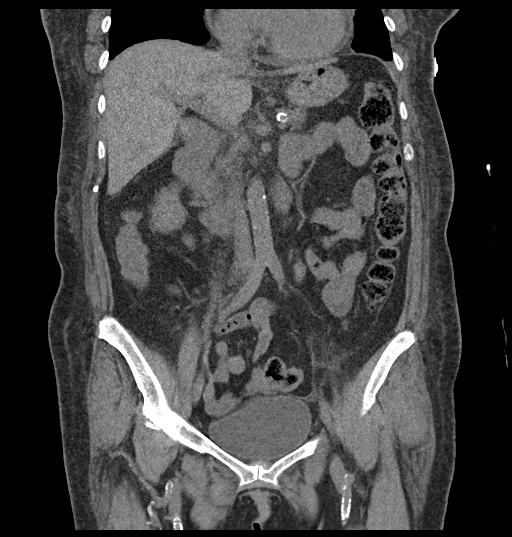
[im 44/80  soft-tissue]
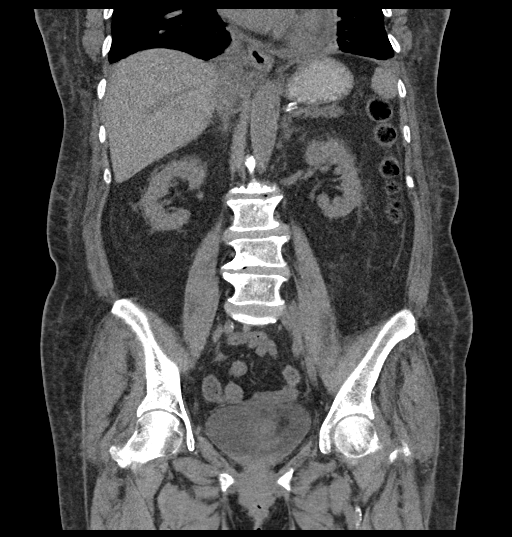

[16 of 46 positions shown; findings below may reference images not displayed]

FINDINGS: Lower chest: Cardiomegaly with trace pericardial effusion. Small
bilateral pleural effusions with adjacent atelectasis.

Hepatobiliary: Uncomplicated cholelithiasis with a 7 mm dependent
gallstone seen near the neck. No secondary signs of acute
cholecystitis. The unenhanced liver is unremarkable.

Pancreas: Mild peripancreatic edema about the head and uncinate
process. Correlate to exclude pancreatitis. No ductal dilatation or
mass.

Spleen: No splenomegaly. Small splenule near the hilum and lower
pole.

Adrenals/Urinary Tract: Normal bilateral adrenal glands. Nonspecific
perinephric fat stranding about both kidneys. No
hydroureteronephrosis. Unremarkable appearance of the urinary
bladder without focal mural thickening or calculus.

Stomach/Bowel: Contrast filled stomach with normal small bowel
rotation. No bowel obstruction or inflammation. Normal appendix and
colon.

Vascular/Lymphatic: Mild aortoiliac atherosclerosis without
aneurysm. No lymphadenopathy.

Reproductive: Hysterectomy.  No adnexal mass.

Other: Small amount of free fluid. Soft tissue anasarca of the
abdomen and pelvis.

Musculoskeletal: No aggressive osseous abnormality. Degenerative
disc disease L2-3 and L4-5 with mild concentric disc bulges.
IMPRESSION: 1. Cardiomegaly, small bilateral pleural effusions, small amount of
free fluid in the pelvis and mild diffuse soft tissue anasarca may
reflect changes of right heart failure.
2. Nonspecific perinephric fat stranding is seen about both kidneys
which can be seen in urinary tract infections. No obstructive
uropathy.
3. Uncomplicated cholelithiasis.
4. Mild peripancreatic edema about the pancreatic head and uncinate
process. This could represent pancreatitis. Correlate clinically.
These results were called by telephone at the time of interpretation
on 06/21/2017 at [DATE] to Dr. JOOJO WARDI , who verbally
acknowledged these results.
